# Patient Record
Sex: Female | Born: 1990 | Race: White | Hispanic: No | Marital: Single
Health system: Southern US, Community
[De-identification: ages and names within clinical notes are randomized; demographics above are authoritative.]

## PROBLEM LIST (undated history)

## (undated) HISTORY — PX: BACK SURGERY: SHX140

## (undated) HISTORY — PX: EYE SURGERY: SHX253

---
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
## 2010-06-18 ENCOUNTER — Emergency Department (HOSPITAL_BASED_OUTPATIENT_CLINIC_OR_DEPARTMENT_OTHER): Admission: EM | Admit: 2010-06-18 | Discharge: 2010-06-19 | Payer: Self-pay | Admitting: Emergency Medicine

## 2017-12-04 ENCOUNTER — Encounter (HOSPITAL_COMMUNITY): Payer: Self-pay | Admitting: Emergency Medicine

## 2017-12-04 ENCOUNTER — Other Ambulatory Visit: Payer: Self-pay

## 2017-12-04 ENCOUNTER — Emergency Department (HOSPITAL_COMMUNITY)
Admission: EM | Admit: 2017-12-04 | Discharge: 2017-12-04 | Disposition: A | Discharge status: Home / Self Care | Payer: Worker's Compensation | Attending: Emergency Medicine | Admitting: Emergency Medicine

## 2017-12-04 ENCOUNTER — Emergency Department (HOSPITAL_COMMUNITY): Payer: Worker's Compensation

## 2017-12-04 DIAGNOSIS — Y990 Civilian activity done for income or pay: Secondary | ICD-10-CM | POA: Insufficient documentation

## 2017-12-04 DIAGNOSIS — W109XXA Fall (on) (from) unspecified stairs and steps, initial encounter: Secondary | ICD-10-CM | POA: Insufficient documentation

## 2017-12-04 DIAGNOSIS — Y9301 Activity, walking, marching and hiking: Secondary | ICD-10-CM | POA: Insufficient documentation

## 2017-12-04 DIAGNOSIS — T148XXA Other injury of unspecified body region, initial encounter: Secondary | ICD-10-CM

## 2017-12-04 DIAGNOSIS — R51 Headache: Secondary | ICD-10-CM | POA: Insufficient documentation

## 2017-12-04 DIAGNOSIS — Y9289 Other specified places as the place of occurrence of the external cause: Secondary | ICD-10-CM | POA: Insufficient documentation

## 2017-12-04 DIAGNOSIS — S0083XA Contusion of other part of head, initial encounter: Secondary | ICD-10-CM | POA: Insufficient documentation

## 2017-12-04 DIAGNOSIS — S0990XA Unspecified injury of head, initial encounter: Secondary | ICD-10-CM

## 2017-12-04 DIAGNOSIS — S8391XA Sprain of unspecified site of right knee, initial encounter: Secondary | ICD-10-CM | POA: Diagnosis not present

## 2017-12-04 LAB — POC URINE PREG, ED: Preg Test, Ur: NEGATIVE

## 2017-12-04 MED ORDER — BACITRACIN ZINC 500 UNIT/GM EX OINT
TOPICAL_OINTMENT | Freq: Once | CUTANEOUS | Status: AC
  Administered 2017-12-04: 14:00:00 via TOPICAL
  Filled 2017-12-04: qty 2.7

## 2017-12-04 MED ORDER — ACETAMINOPHEN 500 MG PO TABS
1000.0000 mg | ORAL_TABLET | Freq: Once | ORAL | Status: AC
  Administered 2017-12-04: 1000 mg via ORAL
  Filled 2017-12-04: qty 2

## 2017-12-04 NOTE — ED Notes (Signed)
Patient bilateral knee abrasions cleaned with soap and water, bacitracin applied per order, and bandage placed.

## 2017-12-04 NOTE — Discharge Instructions (Signed)
You can take Tylenol or Ibuprofen as directed for pain. You can alternate Tylenol and Ibuprofen every 4 hours. If you take Tylenol at 1pm, then you can take Ibuprofen at 5pm. Then you can take Tylenol again at 9pm.   Follow the RICE (Rest, Ice, Compression, Elevation) protocol as directed.   As we discussed, once the swelling in her face comes down, if you are still having pain, he may need to be evaluated to have a repeat x-ray of the nose.  Follow-up with your primary care doctor in the next 2-4 days for further evaluation.  Return to the emergency department for any vision changes, difficulty walking, numbness/weakness of your arms or legs, persistent vomiting, confusion or any other worsening or concerning symptoms.

## 2017-12-04 NOTE — ED Provider Notes (Signed)
[facility]-EMERGENCY DEPT Provider Note   CSN: [id number] Arrival date & time: 12/04/17  [id number]     History   Chief Complaint Chief Complaint  Patient presents with  . Fall    HPI Crystal Benjamin is a 27 y.o. female who presents for evaluation of mechanical fall that occurred approximately at 9 AM this morning.  Patient reports that she was at work when she tripped and fell walking up the stairs, causing her to fall and landed on her bilateral knees and hit her head against the door.  Patient states that she did not have any LOC.  She was able to immediately get up after the incident and has been ambulatory since.  No LOC, vomiting, difficulty ambulating, numbness/weakness of her extremities since the incident.  On ED arrival, patient is complaining of diffuse headache and right knee pain.  Patient states that she is not currently on any blood thinners.  Patient has not taken any medication for the pain.  Patient denies any vision changes, chest pain, difficulty breathing, numbness/weakness of her arms or legs, neck pain, vomiting.  The history is provided by the patient.    History reviewed. No pertinent past medical history.  There are no active problems to display for this patient.   Past Surgical History:  Procedure Laterality Date  . BACK SURGERY    . EYE SURGERY      OB History    No data available       Home Medications    Prior to Admission medications   Not on File    Family History No family history on file.  Social History Social History   Tobacco Use  . Smoking status: Not on file  Substance Use Topics  . Alcohol use: Not on file  . Drug use: Not on file     Allergies   Patient has no known allergies.   Review of Systems Review of Systems  Eyes: Negative for visual disturbance.  Respiratory: Negative for shortness of breath.   Cardiovascular: Negative for chest pain.  Gastrointestinal: Negative for vomiting.    Musculoskeletal:       Right knee pain  Neurological: Positive for headaches. Negative for weakness and numbness.     Physical Exam Updated Vital Signs BP 115/78 (BP Location: Right Arm)   Pulse 90   Temp 98.9 F (37.2 C) (Oral)   Resp 18   LMP 11/03/2017   SpO2 100%   Physical Exam  Constitutional: She is oriented to person, place, and time. She appears well-developed and well-nourished.  HENT:  Head: Normocephalic and atraumatic.    Right Ear: Tympanic membrane normal. No hemotympanum.  Left Ear: Tympanic membrane normal. No hemotympanum.  Mouth/Throat: Oropharynx is clear and moist and mucous membranes are normal.  Eyes: Conjunctivae, EOM and lids are normal. Pupils are equal, round, and reactive to light.  Neck: Full passive range of motion without pain.  Full flexion/extension and lateral movement of neck fully intact. No bony midline tenderness. No deformities or crepitus.   Cardiovascular: Normal rate, regular rhythm, normal heart sounds and normal pulses. Exam reveals no gallop and no friction rub.  No murmur heard. Pulmonary/Chest: Effort normal and breath sounds normal.  Abdominal: Soft. Normal appearance. There is no tenderness. There is no rigidity and no guarding.  Musculoskeletal: Normal range of motion.  Tenderness palpation to the anterior aspect of the right knee with overlying abrasion.  No soft tissue swelling, ecchymosis, deformity or crepitus.  Flexion/extension intact.  No instability noted on varus or valgus stress.  Negative anterior and posterior drawer test.  Patient is able to hold the leg in extension against gravity.  Small abrasion noted to the anterior aspect of the left knee.  No deformity or crepitus noted.  Neurological: She is alert and oriented to person, place, and time.  Cranial nerves III-XII intact Follows commands, Moves all extremities  5/5 strength to BUE and BLE  Sensation intact throughout all major nerve distributions Normal  finger to nose. No dysdiadochokinesia. No pronator drift. No gait abnormalities  No slurred speech. No facial droop.   Skin: Skin is warm and dry. Capillary refill takes less than 2 seconds.  Psychiatric: She has a normal mood and affect. Her speech is normal.  Nursing note and vitals reviewed.    ED Treatments / Results  Labs (all labs ordered are listed, but only abnormal results are displayed) Labs Reviewed  POC URINE PREG, ED    EKG  EKG Interpretation None       Radiology Dg Knee Complete 4 Views Right  Result Date: 12/04/2017 CLINICAL DATA:  Knee pain after falling on the stairs today. EXAM: RIGHT KNEE - COMPLETE 4+ VIEW COMPARISON:  None. FINDINGS: No evidence of fracture, dislocation, or joint effusion. No evidence of arthropathy or other focal bone abnormality. Soft tissues are unremarkable. IMPRESSION: Normal radiographs. Electronically Signed   By: Paulina Fusi M.D.   On: 12/04/2017 13:27    Procedures Procedures (including critical care time)  Medications Ordered in ED Medications  bacitracin ointment (not administered)  acetaminophen (TYLENOL) tablet 1,000 mg (1,000 mg Oral Given 12/04/17 1240)     Initial Impression / Assessment and Plan / ED Course  I have reviewed the triage vital signs and the nursing notes.  Pertinent labs & imaging results that were available during my care of the patient were reviewed by me and considered in my medical decision making (see chart for details).     27 y.o. female who presents for evaluation of mechanical fall.  Complaining of headache and right knee pain.  No LOC, vomiting, numbness/weakness of her arms or legs, vision changes.  Not currently on blood thinners. Patient is afebrile, non-toxic appearing, sitting comfortably on examination table. Vital signs reviewed and stable. No neuro deficits noted on exam. Given reassuring physical exam and per [facility] CT criteria, no imaging is indicated at this time. Given  reassuring exam and NEXUS criteria, no indication for cervical imaging at this time.  On exam, patient does have some tenderness palpation to the right knee.  Suspect sprain versus muscle strain.  Low suspicion for fracture dislocation given exam.  I discussed with patient regarding need for x-ray imaging.  At this time, I suspect that patient most likely has a sprain but offered x-ray evaluation of patient's wishes.  Patient would like to have x-ray at this time.  I feel this is reasonable. Analgesics provided in the department.  Knee x-ray reviewed.  Negative for any acute abnormality.  I discussed results with patient.  Symptoms likely result of a minor sprain.  Patient has ambulated in the department without any difficulty.  Will apply a knee sleeve here in the department.  I discussed conservative therapies, including RICE protocol.  Discussed with patient regarding head injury return precautions.  Vital signs are stable.  Patient ambulated in the part without any difficulty.  She is tolerating p.o. in the department without any difficulty.  Patient stable for discharge  at this time. Patient had ample opportunity for questions and discussion. All patient's questions were answered with full understanding. Strict return precautions discussed. Patient expresses understanding and agreement to plan.    Final Clinical Impressions(s) / ED Diagnoses   Final diagnoses:  Injury of head, initial encounter  Hematoma  Sprain of right knee, unspecified ligament, initial encounter    ED Discharge Orders    None       Rosana Hoes 12/04/17 1338    Charlynne Pander, MD 12/04/17 [phone]

## 2017-12-04 NOTE — ED Triage Notes (Signed)
Pt fell up stairs resulting in hitting forehead on a door; pt alert and oriented x4.

## 2018-10-29 IMAGING — CR DG KNEE COMPLETE 4+V*R*
4 series · 4 of 4 positions shown · non-contrast
Comparison: None.

CLINICAL DATA: Knee pain after falling on the stairs today.

EXAM:
RIGHT KNEE - COMPLETE 4+ VIEW

[t knee ap right]
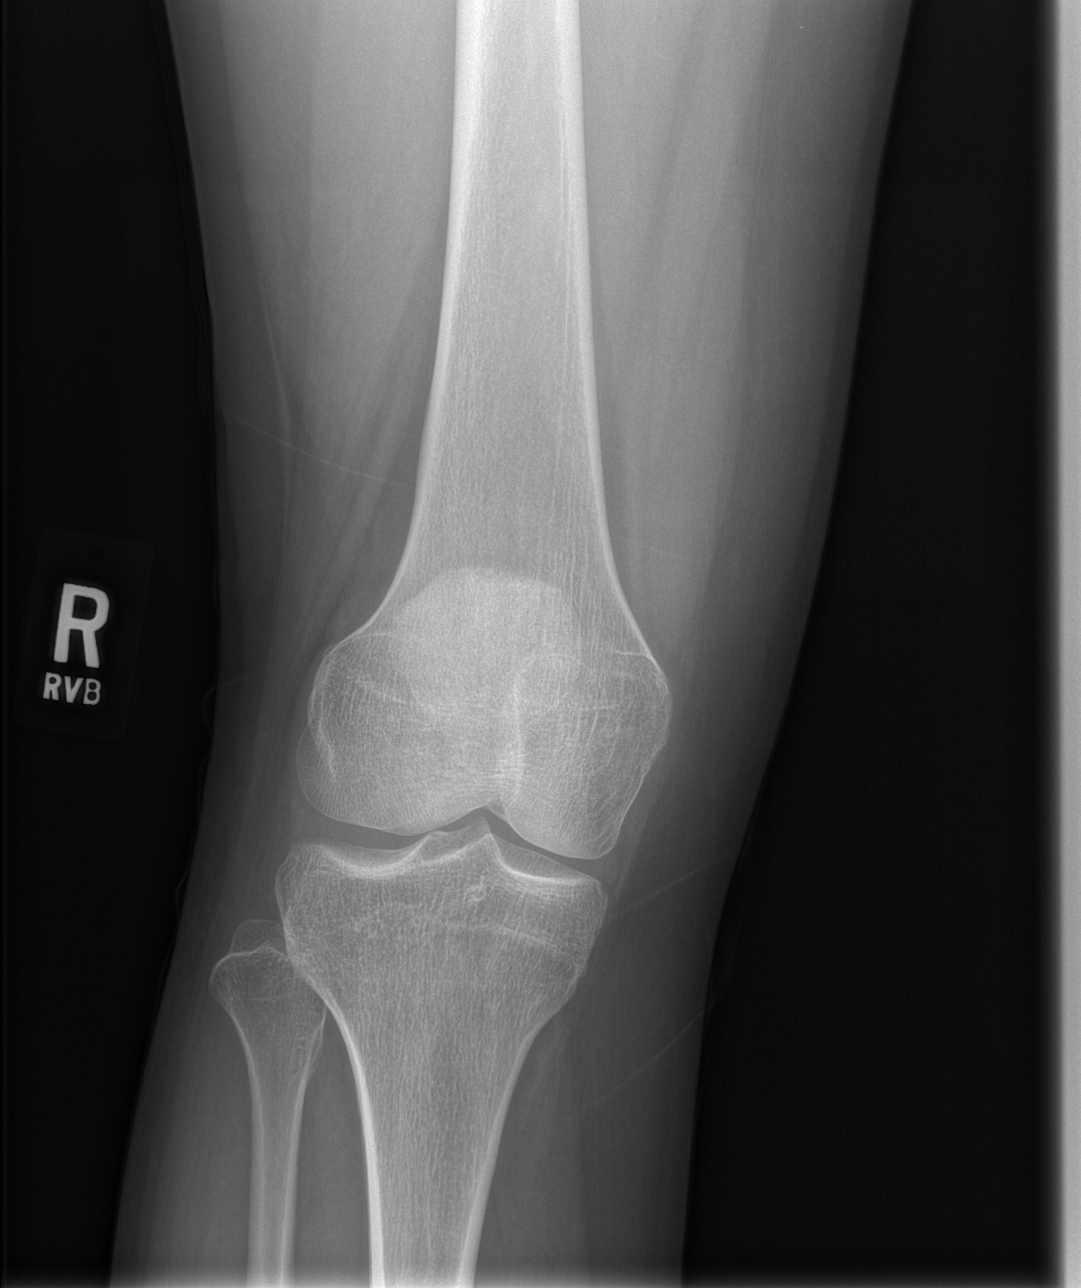

[t knee obl right (1 of 2)]
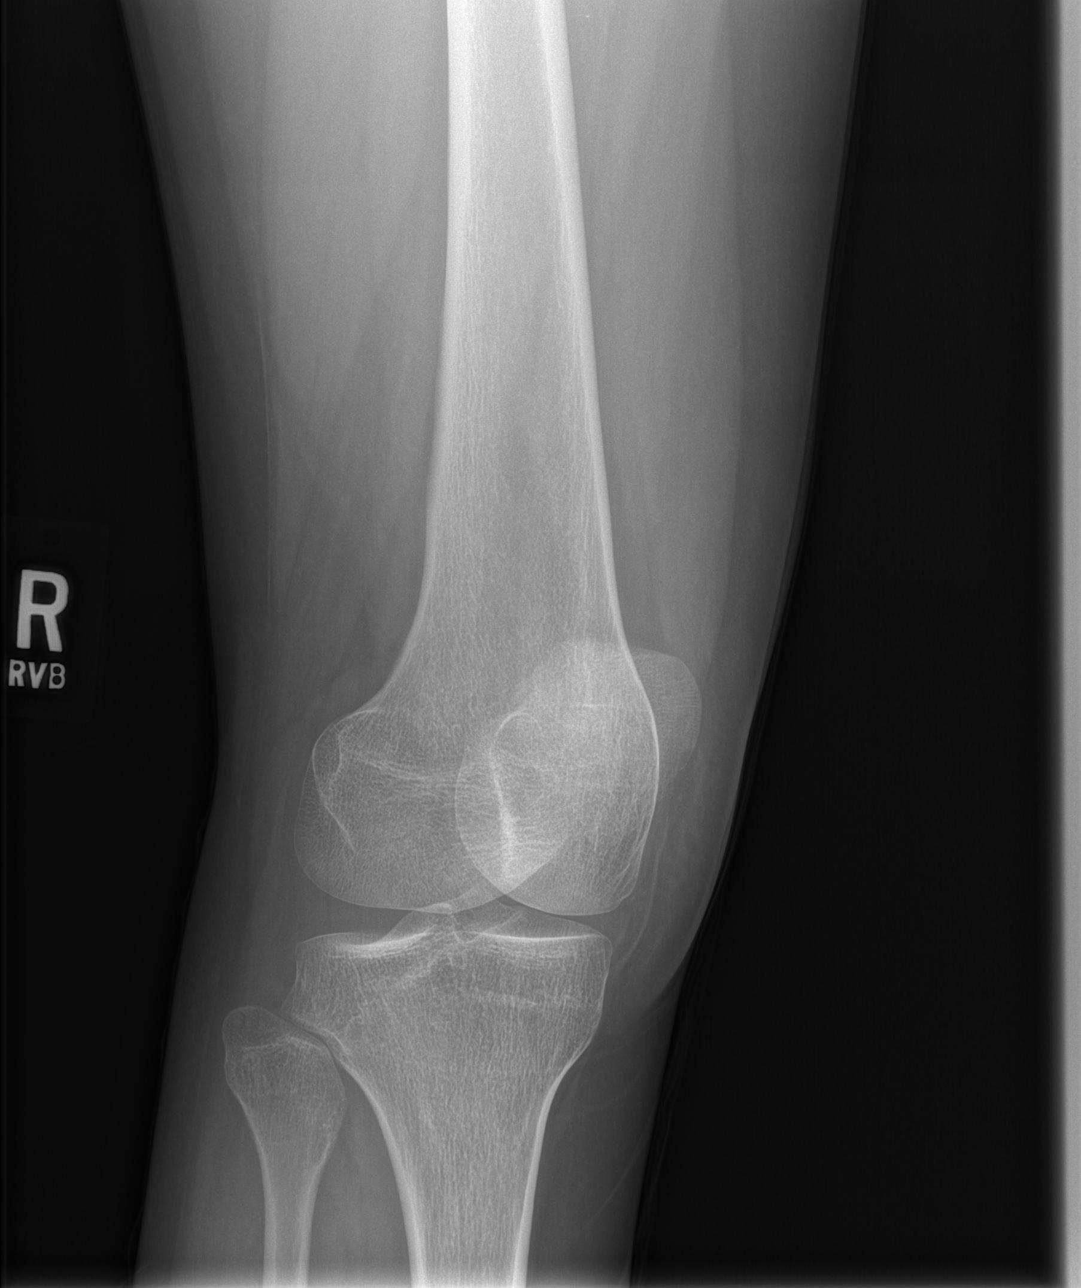

[t knee obl right (2 of 2)]
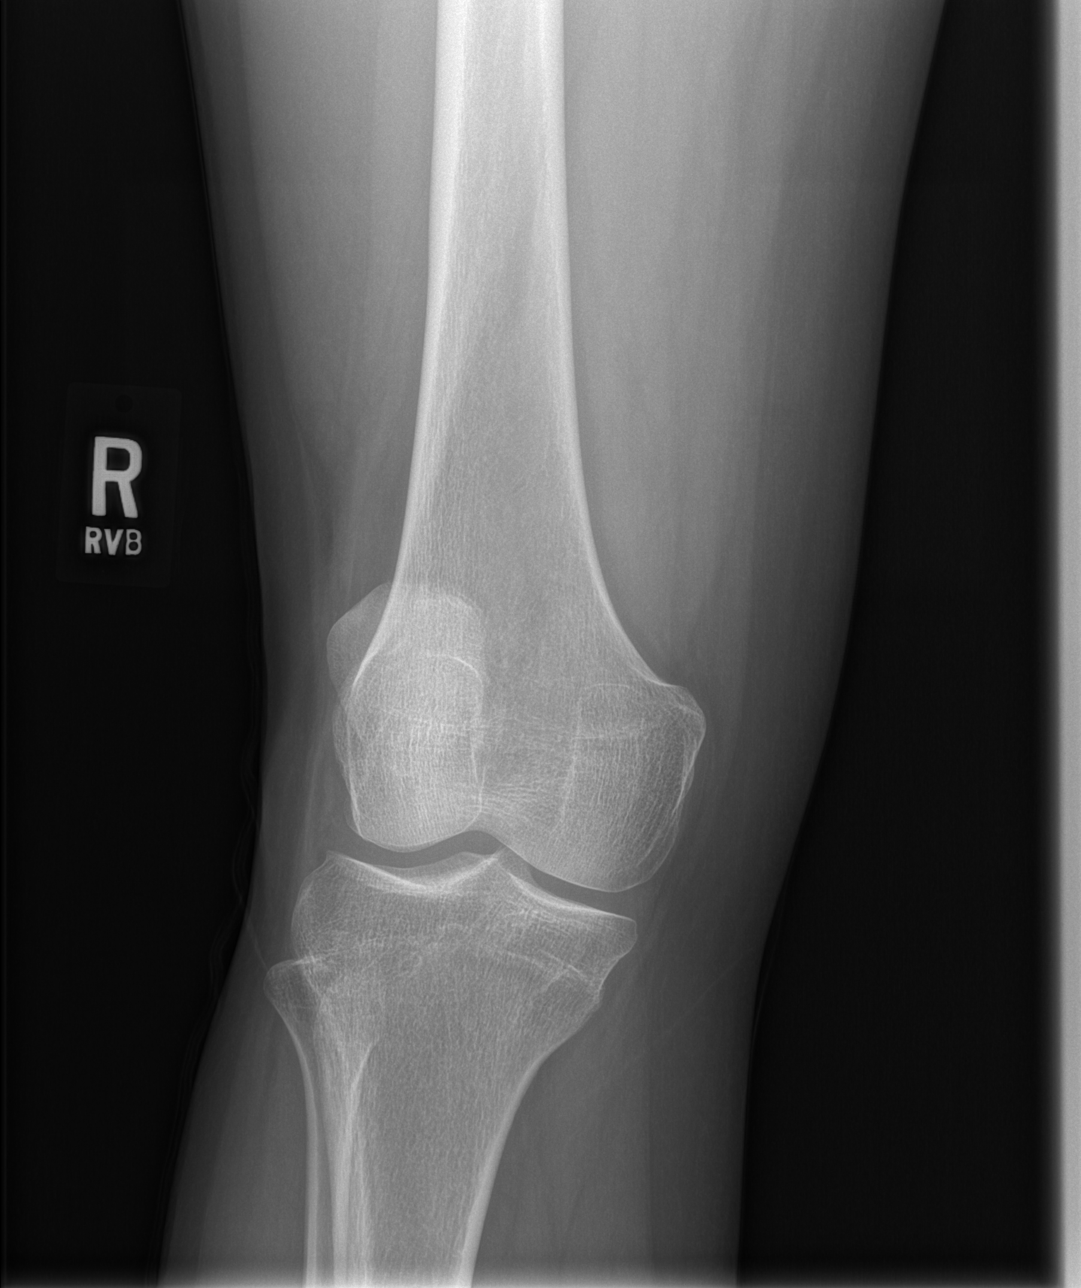

[t knee lat right]
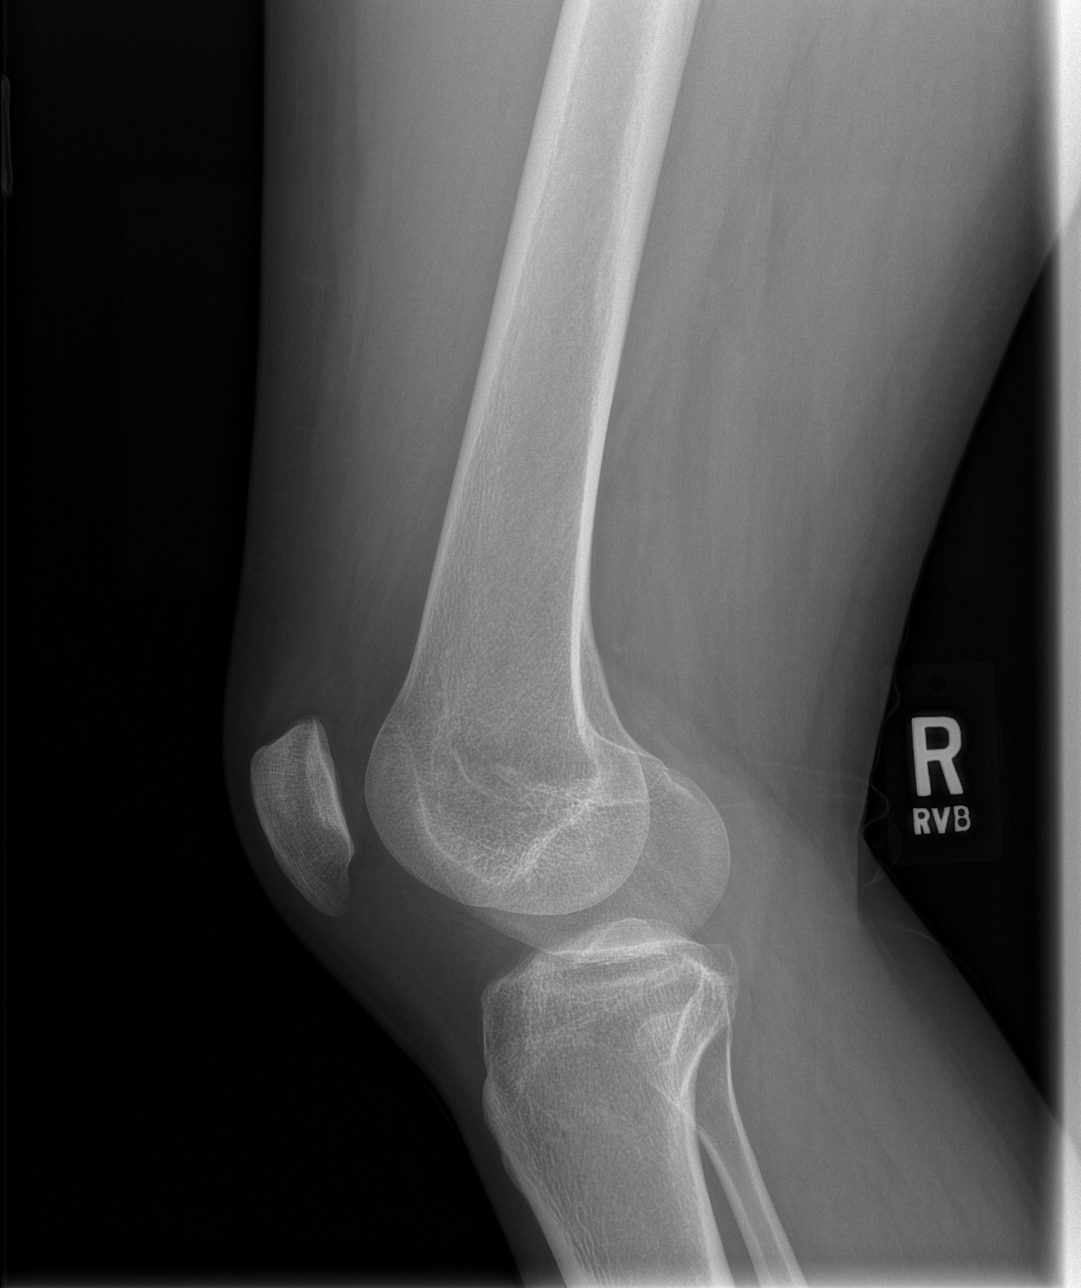

[4 of 4 positions shown; findings below may reference images not displayed]

FINDINGS: No evidence of fracture, dislocation, or joint effusion. No evidence
of arthropathy or other focal bone abnormality. Soft tissues are
unremarkable.
IMPRESSION: Normal radiographs.
